# Patient Record
Sex: Male | Born: 1935 | Race: White | Hispanic: No | Marital: Married | Smoking: Never smoker
Health system: Southern US, Community
[De-identification: ages and names within clinical notes are randomized; demographics above are authoritative.]

## PROBLEM LIST (undated history)

## (undated) DIAGNOSIS — K219 Gastro-esophageal reflux disease without esophagitis: Secondary | ICD-10-CM

## (undated) DIAGNOSIS — H3530 Unspecified macular degeneration: Secondary | ICD-10-CM

## (undated) DIAGNOSIS — N400 Enlarged prostate without lower urinary tract symptoms: Secondary | ICD-10-CM

## (undated) DIAGNOSIS — E785 Hyperlipidemia, unspecified: Secondary | ICD-10-CM

## (undated) HISTORY — PX: HERNIA REPAIR: SHX51

## (undated) HISTORY — PX: HEMORRHOID SURGERY: SHX153

## (undated) HISTORY — PX: TONSILLECTOMY: SUR1361

## (undated) HISTORY — PX: CATARACT EXTRACTION: SUR2

---

## 1999-02-16 ENCOUNTER — Ambulatory Visit (HOSPITAL_COMMUNITY): Admission: RE | Admit: 1999-02-16 | Discharge: 1999-02-16 | Payer: Self-pay | Admitting: *Deleted

## 1999-05-25 ENCOUNTER — Ambulatory Visit (HOSPITAL_COMMUNITY): Admission: RE | Admit: 1999-05-25 | Discharge: 1999-05-25 | Payer: Self-pay | Admitting: *Deleted

## 1999-11-02 ENCOUNTER — Encounter: Admission: RE | Admit: 1999-11-02 | Discharge: 1999-11-02 | Payer: Self-pay | Admitting: *Deleted

## 1999-11-02 ENCOUNTER — Encounter: Payer: Self-pay | Admitting: *Deleted

## 1999-12-14 ENCOUNTER — Encounter: Payer: Self-pay | Admitting: *Deleted

## 1999-12-14 ENCOUNTER — Ambulatory Visit (HOSPITAL_COMMUNITY): Admission: RE | Admit: 1999-12-14 | Discharge: 1999-12-14 | Payer: Self-pay | Admitting: *Deleted

## 2001-08-07 ENCOUNTER — Ambulatory Visit (HOSPITAL_COMMUNITY): Admission: RE | Admit: 2001-08-07 | Discharge: 2001-08-07 | Payer: Self-pay | Admitting: *Deleted

## 2002-07-23 ENCOUNTER — Encounter: Payer: Self-pay | Admitting: Family Medicine

## 2002-07-23 ENCOUNTER — Encounter: Admission: RE | Admit: 2002-07-23 | Discharge: 2002-07-23 | Payer: Self-pay | Admitting: Family Medicine

## 2003-06-25 ENCOUNTER — Encounter: Payer: Self-pay | Admitting: Urology

## 2003-06-29 ENCOUNTER — Inpatient Hospital Stay (HOSPITAL_COMMUNITY): Admission: RE | Admit: 2003-06-29 | Discharge: 2003-07-01 | Payer: Self-pay | Admitting: Urology

## 2004-07-12 ENCOUNTER — Ambulatory Visit (HOSPITAL_COMMUNITY): Admission: RE | Admit: 2004-07-12 | Discharge: 2004-07-12 | Payer: Self-pay | Admitting: Family Medicine

## 2004-12-01 ENCOUNTER — Encounter: Admission: RE | Admit: 2004-12-01 | Discharge: 2005-01-18 | Payer: Self-pay | Admitting: Neurology

## 2007-04-11 ENCOUNTER — Encounter: Payer: Self-pay | Admitting: Family Medicine

## 2007-04-25 ENCOUNTER — Ambulatory Visit (HOSPITAL_COMMUNITY): Admission: RE | Admit: 2007-04-25 | Discharge: 2007-04-25 | Payer: Self-pay | Admitting: Interventional Radiology

## 2007-05-12 ENCOUNTER — Encounter: Payer: Self-pay | Admitting: Interventional Radiology

## 2007-06-18 ENCOUNTER — Ambulatory Visit (HOSPITAL_COMMUNITY): Admission: RE | Admit: 2007-06-18 | Discharge: 2007-06-18 | Payer: Self-pay | Admitting: Interventional Radiology

## 2007-06-24 ENCOUNTER — Encounter (INDEPENDENT_AMBULATORY_CARE_PROVIDER_SITE_OTHER): Payer: Self-pay | Admitting: Interventional Radiology

## 2007-06-24 ENCOUNTER — Ambulatory Visit (HOSPITAL_COMMUNITY): Admission: RE | Admit: 2007-06-24 | Discharge: 2007-06-24 | Payer: Self-pay | Admitting: Interventional Radiology

## 2007-07-09 ENCOUNTER — Encounter: Payer: Self-pay | Admitting: Interventional Radiology

## 2008-03-19 ENCOUNTER — Ambulatory Visit (HOSPITAL_COMMUNITY): Admission: RE | Admit: 2008-03-19 | Discharge: 2008-03-19 | Payer: Self-pay | Admitting: *Deleted

## 2008-08-20 ENCOUNTER — Ambulatory Visit (HOSPITAL_COMMUNITY): Admission: RE | Admit: 2008-08-20 | Discharge: 2008-08-20 | Payer: Self-pay | Admitting: *Deleted

## 2009-04-12 ENCOUNTER — Ambulatory Visit (HOSPITAL_BASED_OUTPATIENT_CLINIC_OR_DEPARTMENT_OTHER): Admission: RE | Admit: 2009-04-12 | Discharge: 2009-04-13 | Payer: Self-pay | Admitting: Urology

## 2009-04-12 ENCOUNTER — Encounter (INDEPENDENT_AMBULATORY_CARE_PROVIDER_SITE_OTHER): Payer: Self-pay | Admitting: Urology

## 2010-07-20 ENCOUNTER — Encounter (INDEPENDENT_AMBULATORY_CARE_PROVIDER_SITE_OTHER): Payer: Self-pay | Admitting: Urology

## 2010-07-20 ENCOUNTER — Ambulatory Visit (HOSPITAL_COMMUNITY): Admission: RE | Admit: 2010-07-20 | Discharge: 2010-07-21 | Payer: Self-pay | Admitting: Urology

## 2010-07-23 ENCOUNTER — Emergency Department (HOSPITAL_COMMUNITY): Admission: EM | Admit: 2010-07-23 | Discharge: 2010-07-23 | Payer: Self-pay | Admitting: Emergency Medicine

## 2010-08-25 ENCOUNTER — Ambulatory Visit (HOSPITAL_BASED_OUTPATIENT_CLINIC_OR_DEPARTMENT_OTHER): Admission: RE | Admit: 2010-08-25 | Discharge: 2010-08-25 | Payer: Self-pay | Admitting: Urology

## 2010-12-28 LAB — CBC
HCT: 42.9 % (ref 39.0–52.0)
MCH: 30.9 pg (ref 26.0–34.0)
MCV: 91.5 fL (ref 78.0–100.0)
Platelets: 165 10*3/uL (ref 150–400)
RBC: 4.69 MIL/uL (ref 4.22–5.81)
WBC: 6.8 10*3/uL (ref 4.0–10.5)

## 2010-12-28 LAB — URINALYSIS, ROUTINE W REFLEX MICROSCOPIC
Glucose, UA: NEGATIVE mg/dL
Ketones, ur: 15 mg/dL — AB
Protein, ur: >300 mg/dL — AB
pH: 6 (ref 5.0–8.0)

## 2010-12-28 LAB — BASIC METABOLIC PANEL
BUN: 11 mg/dL (ref 6–23)
CO2: 28 mEq/L (ref 19–32)
Chloride: 108 mEq/L (ref 96–112)
Creatinine, Ser: 1.09 mg/dL (ref 0.4–1.5)
GFR calc Af Amer: >60 mL/min (ref >60)
Potassium: 5.5 mEq/L — ABNORMAL HIGH (ref 3.5–5.1)

## 2010-12-28 LAB — URINE CULTURE: Culture  Setup Time: 201110091725

## 2010-12-28 LAB — URINE MICROSCOPIC-ADD ON

## 2011-01-22 LAB — POCT HEMOGLOBIN-HEMACUE: Hemoglobin: 15.4 g/dL (ref 13.0–17.0)

## 2011-02-27 NOTE — Op Note (Signed)
NAME:  Jonathan Newton, Jonathan Newton NO.:  [id number]   MEDICAL RECORD NO.:  [id number]          PATIENT TYPE:  AMB   LOCATION:  ENDO                         FACILITY:  [facility]   PHYSICIAN:  Georgiana Spinner, M.D.    DATE OF BIRTH:  [birth year]   DATE OF PROCEDURE:  08/20/2008  DATE OF DISCHARGE:                               OPERATIVE REPORT   PROCEDURE:  Colonoscopy.   INDICATIONS FOR PROCEDURE:  Colon polyp followup.   ANESTHESIA:  Fentanyl 75 mcg, Versed 7.5 mg.   PROCEDURE IN DETAIL:  With the patient mildly sedated in the left  lateral decubitus position a rectal examination was performed which was  unremarkable.  Subsequently the Pentax videoscopic colonoscope was  inserted in the rectum and passed under direct vision to the cecum.  With pressure applied and the patient rolled to his back and  subsequently to his right side cecum was identified by the ileocecal  valve and base of cecum both of which were photographed.  From this  point the colonoscope was slowly withdrawn taking circumferential views  of colonic mucosa stopping in the rectum which appeared normal on direct  and showed hemorrhoids on retroflexed view.  The endoscope was  straightened and withdrawn.  The patient's vital signs, pulse oximeter  remained stable.  The patient tolerated the procedure well without  apparent complications.   FINDINGS:  Internal hemorrhoids, some diverticulosis of sigmoid colon,  otherwise unremarkable exam.   PLAN:  Have patient follow up with me as needed.           ______________________________  Georgiana Spinner, M.D.     GMO/MEDQ  D:  08/20/2008  T:  08/20/2008  Job:  [id number]

## 2011-02-27 NOTE — Op Note (Signed)
NAME:  Jonathan Newton, Jonathan Newton NO.:  [id number]   MEDICAL RECORD NO.:  [id number]          PATIENT TYPE:  AMB   LOCATION:  ENDO                         FACILITY:  [facility]   PHYSICIAN:  Georgiana Spinner, M.D.    DATE OF BIRTH:  [birth year]   DATE OF PROCEDURE:  03/19/2008  DATE OF DISCHARGE:                               OPERATIVE REPORT   PROCEDURE:  Upper endoscopy with dilation.   ENDOSCOPIST:  Georgiana Spinner, M.D.   INDICATIONS:  Dysphagia.   ANESTHESIA:  Fentanyl 50 mcg, Versed 3 mg.   PROCEDURE:  With the patient mildly sedated in room 4 of Endoscopy at  [facility], the Pentax videoscopic endoscope was inserted in  the mouth and passed under direct vision through the esophagus, which  appeared normal, where we reached the distal esophagus and there  appeared to be resistance to the passage of the endoscope, but we were  able to get the endoscope through and it was tight enough that it caused  a dilation of the distal esophagus with some bleeding noted, minimal,  but we were able to advance into the stomach; fundus, body, antrum,  duodenal bulb and second portion of the duodenum were visualized.  From  this point, the endoscope was slowly withdrawn, taking circumferential  views of duodenal mucosa until the endoscope had been pulled back into  the stomach and placed in retroflexion to view the stomach from below.  The endoscope was then straightened and withdrawn, taking  circumferential views of the remaining gastric and esophageal mucosa.  I  elected at that point to not dilate any further because I felt that the  dilation done by the endoscope was adequate.  The endoscope was then  withdrawn, taking circumferential views of the remaining gastric and  esophageal mucosa.  The patient's vital signs and pulse oximetry  remained stable.  The patient tolerated procedure well without apparent  complications.   FINDINGS:  Distal esophageal stricture, dilated to  approximately 10 mm.   PLAN:  We will await clinical response  We will have the patient follow  up with me as an outpatient.           ______________________________  Georgiana Spinner, M.D.     GMO/MEDQ  D:  03/19/2008  T:  03/19/2008  Job:  [id number]   cc:   Burnell Blanks, MD  Fax: [phone]

## 2011-02-27 NOTE — Consult Note (Signed)
NAME:  Jonathan Newton, LOMELI NO.:  [id number]   MEDICAL RECORD NO.:  [id number]          PATIENT TYPE:  OUT   LOCATION:  XRAY                         FACILITY:  [facility]   PHYSICIAN:  Sanjeev K. Deveshwar, M.D.DATE OF BIRTH:  [birth year]   DATE OF CONSULTATION:  04/11/2007  DATE OF DISCHARGE:                                 CONSULTATION   CHIEF COMPLAINT:  Compression fracture.   HISTORY OF PRESENT ILLNESS:  This is a very pleasant, active, alert 77-  year-old, white male with history of multiple falls in the past 4-5  weeks.  He has been referred to Dr. [name] through the courtesy of  Dr. Nathanial Rancher for evaluation of a compression fracture, no doubt as a  result of one of his falls.  An MRI of the lumbar spine was performed on  April 02, 2007, that showed a moderately severe compression fracture of  L1 that appeared to be recent.  There was also some retropulsion noted.  There was a mild paraspinous soft tissue thickening of uncertain  etiology.   The patient is accompanied today by his wife and his daughter-in-law.  His daughter-in-law is an Charity fundraiser.  She reports that the patient has had  multiple falls, but yet stays very active.  The patient has been  experiencing back pain especially when getting up in the morning.  He  states his pain is 10/10.  He does better at rest and seems to do better  throughout the day once he gets moving.  The patient stays active with  yard work, although his activity has been limited due to his pain.  He  is taking Aleve and Tylenol.  Apparently, he did have a prescription for  some Vicodin, however, this caused severe constipation.  He presents  today for further evaluation by Dr. [name].   PAST MEDICAL HISTORY:  1. Hyperlipidemia.  2. Gastroesophageal reflux disease.  3. History of colon polyps followed by Dr. Virginia Rochester.  He is due for a      followup exam at this time.  4. Sinus trouble.  5. Herpes simplex.  6. Elevated homocysteine  level.  7. Benign prostatic hypertrophy as well as a history of bladder stones      followed by Dr. Wanda Plump.  8. Macular degeneration.  9. History of osteoporosis with multiple fractures including fractures      of his right wrist, right rib, ankle and right foot.  Apparently,      he was on Actonel in the past but has no longer taking this      medication.  10.The patient has cataracts.  11.History of esophageal strictures.  12.Precancerous polyps.   PAST SURGICAL HISTORY:  1. Tonsillectomy.  2. Bilateral inguinal hernia repair.  3. Hemorrhoid surgery.  4. Bilateral cataract surgery.  5. History of extraction of bladder stones.  6. The patient reports no previous problems with anesthesia.   ALLERGIES:  No known drug allergies, although he has had some stomach  upset with CODEINE and severe constipation with other NARCOTIC  MEDICATIONS.   CURRENT MEDICATIONS:  Ocuvite, Aleve, Tylenol, aspirin  81 mg daily and  Prilosec OTC.   SOCIAL HISTORY:  The patient is married.  He has three grown children.  He lives in [place] with his wife.  He does not use alcohol or tobacco.  He is a retired Electrical engineer.  As noted, he has been very active for  his age until his recent fractures.   FAMILY HISTORY:  His mother died from heart problems.  His father died  at an early age from suicide.   IMPRESSION/RECOMMENDATIONS:  As noted, this patient has a history of an  L1 compression fracture, possibly related to recent falls.  He had an  MRI of the lumbar spine on April 02, 2007, that showed a fairly acute L1  fracture with some retropulsion as well as a mild paraspinous soft  tissue thickening.  The patient and his family brought these films along  for the consultation.  Dr. [name] went over the images and pointed  out the areas of concern as well as the area of retropulsion.  The  kyphoplasty and vertebroplasty procedures were described in detail along  with the potential risks and  benefits.  Because of the retropulsion, it  was felt that the patient might require a vertebroplasty instead of a  kyphoplasty, however, Dr. [name] could not be certain at this time.   The patient and his family are interested in proceeding for pain relief  and stabilization of the fracture.  Unfortunately, Dr. [name] will be  out of the office next week.  We have arranged for the patient to return  on July 11, for his intervention.  We stress that he should not do  anything strenuous from now until then to prevent further compression  and possible further retropulsion.  The patient was also told to hold  his aspirin on the day of the procedure.  Greater than 40 minutes was  spent on this consult.      Delton See, P.A.    ______________________________  Grandville Silos. [name], M.D.    DR/MEDQ  D:  04/11/2007  T:  04/12/2007  Job:  [id number]   cc:   Burnell Blanks, M.D.

## 2011-02-27 NOTE — Op Note (Signed)
NAME:  Jonathan Newton, Jonathan Newton                ACCOUNT NO.:  [id number]   MEDICAL RECORD NO.:  [id number]          PATIENT TYPE:  AMB   LOCATION:  [facility]                         FACILITY:  [facility]   PHYSICIAN:  Sigmund I. Patsi Sears, M.D.DATE OF BIRTH:  08-17-1917   DATE OF PROCEDURE:  04/12/2009  DATE OF DISCHARGE:                               OPERATIVE REPORT   PREOPERATIVE DIAGNOSES:  Benign prostatic hypertrophy with prostate  stones.   POSTOPERATIVE DIAGNOSES:  Benign prostatic hypertrophy with prostate  stones.   OPERATION:  Cystourethroscopy, transurethral resection of prostate,  removal of prostate stones.   SURGEON:  Dr. Patsi Sears.   ANESTHESIA:  General LMA.   PREPARATION:  After appropriate preanesthesia, the patient was brought  to the operating room and placed in the operating room in the dorsal  supine position where general LMA anesthesia was induced.  He was then  replaced in the dorsal lithotomy position where the pubis was prepped  with Betadine solution and draped in the usual fashion.   REVIEW OF HISTORY:  This 77 year old male has a history of gross  hematuria and a history of bladder stones.  He also has CT scan showing  a probable renal cell carcinoma within a cyst.  In addition, cystoscopy  shows the patient has multiple bladder and prostatic stones and bleeding  prostate.  He is now for resection.   PROCEDURE:  Using the Gyrus equipment, vaporization was accomplished of  the prostate and chips were evacuated from the bladder.  Resection was  accomplished from the 2 o'clock to the 6 o'clock position, and from the  11 o'clock to the 6 o'clock position.  Resection was not needed from the  7 to the 5 o'clock positions.  Prostate stones and stones from the  bladder neck were removed by dragging stones through the scope.  The  chips were evacuated from the bladder, and 24 Simplastic catheter placed  to light traction.  The patient was awakened and taken to the  recovery  room in good condition.      Sigmund I. Patsi Sears, M.D.  Electronically Signed     SIT/MEDQ  D:  04/12/2009  T:  04/13/2009  Job:  [id number]

## 2011-02-27 NOTE — Op Note (Signed)
NAME:  Jonathan Newton, Jonathan Newton                ACCOUNT NO.:  [id number]   MEDICAL RECORD NO.:  [id number]          PATIENT TYPE:  AMB   LOCATION:  [facility]                         FACILITY:  [facility]   PHYSICIAN:  Sigmund I. Patsi Sears, M.D.DATE OF BIRTH:  13-Mar-1917   DATE OF PROCEDURE:  04/12/2009  DATE OF DISCHARGE:                               OPERATIVE REPORT   PREOPERATIVE DIAGNOSIS:  Benign prostatic hypertrophy.   POSTOPERATIVE DIAGNOSES:  Benign prostatic hypertrophy, bladder stones.   OPERATIONS:  Cystourethroscopy, transurethral resection of the prostate  with electrovaporization the tissue, and removal of multiple prostatic  stones.   SURGEON:  Sigmund I. Patsi Sears, M.D.   ANESTHESIA:  General LMA.   PREPARATION:  After appropriate preanesthesia, the patient is brought to  the operating room, placed on the operating table in dorsal supine  position where general LMA anesthesia was induced.  He was then replaced  in the dorsal lithotomy position where the pubis was prepped with  Betadine solution and draped in usual fashion.   BRIEF HISTORY:  This 77 year old male has presented with gross painless  hematuria, history of BPH and bladder stones.  CT was accomplished after  an abnormal renal ultrasound was obtained.  The patient was found to  have a 5.5 cm right lower pole exophytic cyst with a 0.9 mm nodule  compatible with cystic renal cell carcinoma.  Cystoscopy revealed the  patient had gross hematuria from bladder stones, prostate stones, and  previously resected prostate fossa with massive regrowth.  The patient  now for TURP and removal of prostate and bladder stone.   PROCEDURE:  Cystourethroscopy accomplished, and photo documentation of  multiple stones were identified.  Stones were removed of the irrigation  catheter and large stone was directed out through the scope.  Vaporization of prostate was then accomplished, beginning at the 11  o'clock position, carried to 6 o'clock  position, and from the 2 o'clock  position to the 6 o'clock position.  There was no resection necessary  from the 11 o'clock to 7 o'clock position because of previous resection.  The verumontanum was identified within the procedure for the entire  case.  A 24 three-way Symplastic catheter was placed, with a plug in the  third lumen.  30 mL was placed in the balloon.  Catheter was irrigated  clear.  The patient will be placed on mild traction, was awakened, and  taken to recovery room in good condition.      Sigmund I. Patsi Sears, M.D.  Electronically Signed     SIT/MEDQ  D:  04/12/2009  T:  04/13/2009  Job:  [id number]

## 2011-03-02 NOTE — Procedures (Signed)
[facility]  Patient:    CAYTON, CUEVAS Visit Number: [id number] MRN: [id number]          Service Type: END Location: ENDO Attending Physician:  Sabino Gasser Proc. Date: 08/07/01 Admit Date:  08/07/2001   CC:         Lenon Curt. Cassell Clement, M.D.   Procedure Report  PROCEDURE:  Colonoscopy.  INDICATIONS:  Colon cancer in a polyp found two years ago.  ANESTHESIA:  Demerol 70, Versed 7 mg.  DESCRIPTION OF PROCEDURE:  With the patient mildly sedated in the left lateral decubitus position, a rectal examination was performed which revealed an enlarged prostate.  Subsequently, the Olympus videoscopic colonoscope was inserted into the rectum and passed under direct vision.  With pressure applied to the abdomen, we finally reached the cecum.  He had a very long colon.  The cecum identified by the ileocecal valve and appendiceal orifice, both of which were photographed.  From this point, the endoscope was slowly withdrawn, taking circumferential views of the entire colonic mucosa, stopping in the rectum which appeared normal on direct view and showed hemorrhoids on retroflexed view.  The endoscope was straightened and withdrawn.  The patients vital signs and pulse oximeter remained stable.  The patient tolerated the procedure well without apparent complications.  FINDINGS:  Large internal hemorrhoids, otherwise unremarkable examination.  PLAN:  Repeat examination in five years. Attending Physician:  Sabino Gasser DD:  08/07/01 TD:  08/08/01 Job: [id number]

## 2011-03-02 NOTE — Discharge Summary (Signed)
   NAME:  Jonathan Newton, CARRERAS NO.:  [id number]   MEDICAL RECORD NO.:  [id number]                   PATIENT TYPE:  INP   LOCATION:  0359                                 FACILITY:  [facility]   PHYSICIAN:  Boston Service, M.D.             DATE OF BIRTH:  05-Oct-1917   DATE OF ADMISSION:  06/29/2003  DATE OF DISCHARGE:  07/01/2003                                 DISCHARGE SUMMARY   LMD:  Burnell Blanks, M.D.   UROLOGIST:  Boston Service, M.D.   Indications, medications, allergies, tobacco, ETOH, past medical history,  social history, physical examination, and review of systems were all  outlined in the admitting note.   HOSPITAL COURSE:  The patient underwent TURP, TUR VaporTrode and  cystolithopexy of six 1 cm stones within the bladder on June 29, 2003.  A 24-French Foley was left indwelling.  The patient had a pleasantly  uneventful postoperative recovery.  Bladder irrigation was discontinued on  the first evening several hours after surgery.  Foley catheter was  discontinued on postoperative day one.  The patient had initial urgency and  occasional urge incontinence.  Urine was cherry red without clots.  The  patient was followed in the hospital on IV hydration.  Urine cleared.  Urinary control improved and patient was ready for discharge on July 01, 2003.   DISCHARGE MEDICATIONS:  1. Macrobid.  2. Pyridium.  3. Vicodin.   FOLLOWUP:  The patient will see [place] back in the office in two weeks.   DISCHARGE DIAGNOSES:  1. Bladder calculi.  2. Benign prostatic hypertrophy.   CONDITION ON DISCHARGE:  Fair.                                               Boston Service, M.D.    RH/MEDQ  D:  07/01/2003  T:  07/01/2003  Job:  [id number]   cc:   Burnell Blanks, M.D.

## 2011-03-02 NOTE — Consult Note (Signed)
NAME:  Jonathan Newton, Jonathan Newton                ACCOUNT NO.:  [id number]   MEDICAL RECORD NO.:  [id number]          PATIENT TYPE:  OUT   LOCATION:  [facility]C                          FACILITY:  [facility]   PHYSICIAN:  Sanjeev K. Deveshwar, M.D.DATE OF BIRTH:  1917/04/18   DATE OF CONSULTATION:  05/12/2007  DATE OF DISCHARGE:                                 CONSULTATION   CHIEF COMPLAINT:  Status post L1 vertebroplasty performed April 25, 2007.   HISTORY OF PRESENT ILLNESS:  This is a very pleasant, active 77 year old  male with a history of multiple falls.  He was referred to Dr. [name]  through the courtesy of Dr. Nathanial Rancher for evaluation of a compression  fracture which was felt to be the result of one of his falls.  An MRI of  the lumbar spine was performed on April 02, 2007, that showed a  moderately severe compression fracture of L1 associated with some  retropulsion.  There was also a mild paraspinous soft tissue thickening  of uncertain etiology.  The patient was seen in consultation by Dr.  [name] on April 11, 2007.  The vertebroplasty and kyphoplasty  procedures were described in detail along with the risks and benefits.  The patient agreed to proceed and on April 25, 2007, he underwent a  vertebroplasty at the L1 level performed by Dr. [name].  A  kyphoplasty was not performed due to the degree of retropulsion.  The  patient returns today accompanied by his daughter-in-law to be seen in  follow-up.   PAST MEDICAL HISTORY:  Significant for  1. Hyperlipidemia.  2. Gastroesophageal reflux disease.  3. History of a colon polyps followed by Dr. Virginia Rochester.  4. History of sinus trouble.  5. Herpes simplex.  6. Elevated homocysteine level.  7. BPH and bladder stones followed by Dr. Wanda Plump.  8. Macular degeneration.  9. Cataracts.  10.Osteoporosis with multiple fractures including fractures of his      right wrist, right ribs, ankle and right foot.  He had been on      Actonel in the past but he is  no longer taking this medication.  11.He has history of esophageal strictures.  12.History of precancerous polyps.   SURGICAL HISTORY:  Significant for  1. Tonsillectomy.  2. Bilateral inguinal hernia repair.  3. Hemorrhoidectomy.  4. Bilateral cataract surgery.  5. History of extraction of bladder stones.  The patient reports no previous problems with anesthesia.   ALLERGIES:  CODEINE causes stomach upset.  He developed severe  constipation with other narcotic medications.   MEDICATIONS:  1. Ocuvite.  2. Aleve.  3. Tylenol.  4. Aspirin 81 mg daily.  5. Prilosec OTC.   SOCIAL HISTORY:  The patient is married.  He has three grown children.  He lives in [place], [place], with his wife.  They have a fairly  large amount of property and the patient stays active with the up-keep  of the grounds.  He is a retired Electrical engineer.  His wife was recently  at [facility] for placement of a permanent pacemaker.   FAMILY HISTORY:  His mother died from heart problems.  His father died  at an early age from suicide.   IMPRESSION AND PLAN:  As noted, the patient returns today to be seen in  follow-up by Dr. [name] after his L1 compression fracture with  vertebroplasty performed April 25, 2007.  The patient reports that his  back pain has completely resolved.  He does have some abdominal  discomfort which he attributes to possible constipation.  He is no  longer taking any pain medications other than occasional Aleve.  He is  anxious to resume his previous level of activity.  He was cautioned to  avoid anything that might put further stress on his back in order to  avoid further compression fractures.  The patient also noted some mild  left hip soreness.  The etiology of this discomfort is uncertain at this  time.  Hopefully, this will continues to improve.   The patient reports his appetite is fine.  He is doing well overall.  Further follow-up will be on a p.r.n. basis.   The patient was told to  contact us or Dr. Nathanial Rancher if he develops any further severe back pain.  Greater than 15 minutes was spent on this visit.      Delton See, P.A.    ______________________________  Grandville Silos. [name], M.D.    DR/MEDQ  D:  05/12/2007  T:  05/13/2007  Job:  [id number]   cc:   Burnell Blanks, MD

## 2011-03-02 NOTE — Op Note (Signed)
NAME:  Jonathan Newton, Jonathan Newton NO.:  [id number]   MEDICAL RECORD NO.:  [id number]                   PATIENT TYPE:  INP   LOCATION:  0007                                 FACILITY:  [facility]   PHYSICIAN:  Boston Service, M.D.             DATE OF BIRTH:  [birth year]   DATE OF PROCEDURE:  06/29/2003  DATE OF DISCHARGE:                                 OPERATIVE REPORT   PREOPERATIVE DIAGNOSIS:  An 77 year old male with benign prostatic  hypertrophy and a bladder stone, transurethral resection and cystolithopexy  in January 1985.  The patient has had intermittent follow-up with our office  since then.  Cystoscopy in July 2003 showed recurrent bladder calculi; the  patient refused treatment at that time.  He presented back to the office in  August 2004.  A renal ultrasound showed a 9.4 cm right kidney, a 10.7 cm  left kidney.  Cystoscopy showed about six calculi within the bladder; each  of them measured about 1 cm total stone size greater than 5 cm, with  nonobstructive regrowth within the prostatic urethra.  Discussed with the  patient and family members, the risks and benefits.  They understand and  agree to proceed with cystoscopy under anesthesia.  Cystolithopexy (Homium  laser), TURP, TUR VaporTrode.   POSTOPERATIVE DIAGNOSES:  Same.   PROCEDURE:  1. Cystoscopy under anesthesia.  2. TURP/TUR VaporTrode.  3. Cystolithopexy.   DRAINS:  24-French Foley catheter.   ANESTHESIA:  General.   DESCRIPTION OF PROCEDURE:  The patient was prepped and draped in the dorsal  lithotomy position, after institution of adequate level of general  anesthesia.  Well-lubricated 21-French panendoscope, with the 1000 Homium  fiber, was then passed per urethra.  Normal urethra, active and intact  sphincter.  The prostatic urethra showed nonobstructive regrowth.  The  patient had six stones within the bladder, each of which measured about 1  cm.   Fragmentation was begun with  the 1000 Homium fiber.  Stones were  demonstrated to be quite dense, and over a period of 60-90 mins they were  gradually fragmented to the point that they could be evacuated with the  Helico evacuator.  Stony fragments were collected, and then the scope was  removed and replaced with the resectoscope sheath.  TURP/TUR VaporTrode  instruments were used.  Resection and vaporization was begun at the 10  o'clock position on the right lobe and carried down to 6 o'clock position,  begun again at the 2 o'clock position on the left  lobe and carried down to the 6 o'clock position.  Once all obstructing  tissue had either been resected or vaporized, resectoscope sheath was  withdrawn.  A 24-French three-way Foley catheter was inserted, left to light  traction.  The patient was given a B&O suppository and returned to recovery  in satisfactory condition.  Boston Service, M.D.    RH/MEDQ  D:  06/29/2003  T:  06/29/2003  Job:  [id number]   cc:   Burnell Blanks, M.D.  [place], [place]

## 2011-03-27 IMAGING — CR DG CHEST 2V
2 series · 2 of 2 positions shown · non-contrast
Comparison: 04/12/2009

CLINICAL DATA: Preoperative respiratory exam.  Chronic urinary
retention.

CHEST - 2 VIEW

[w chest pa]
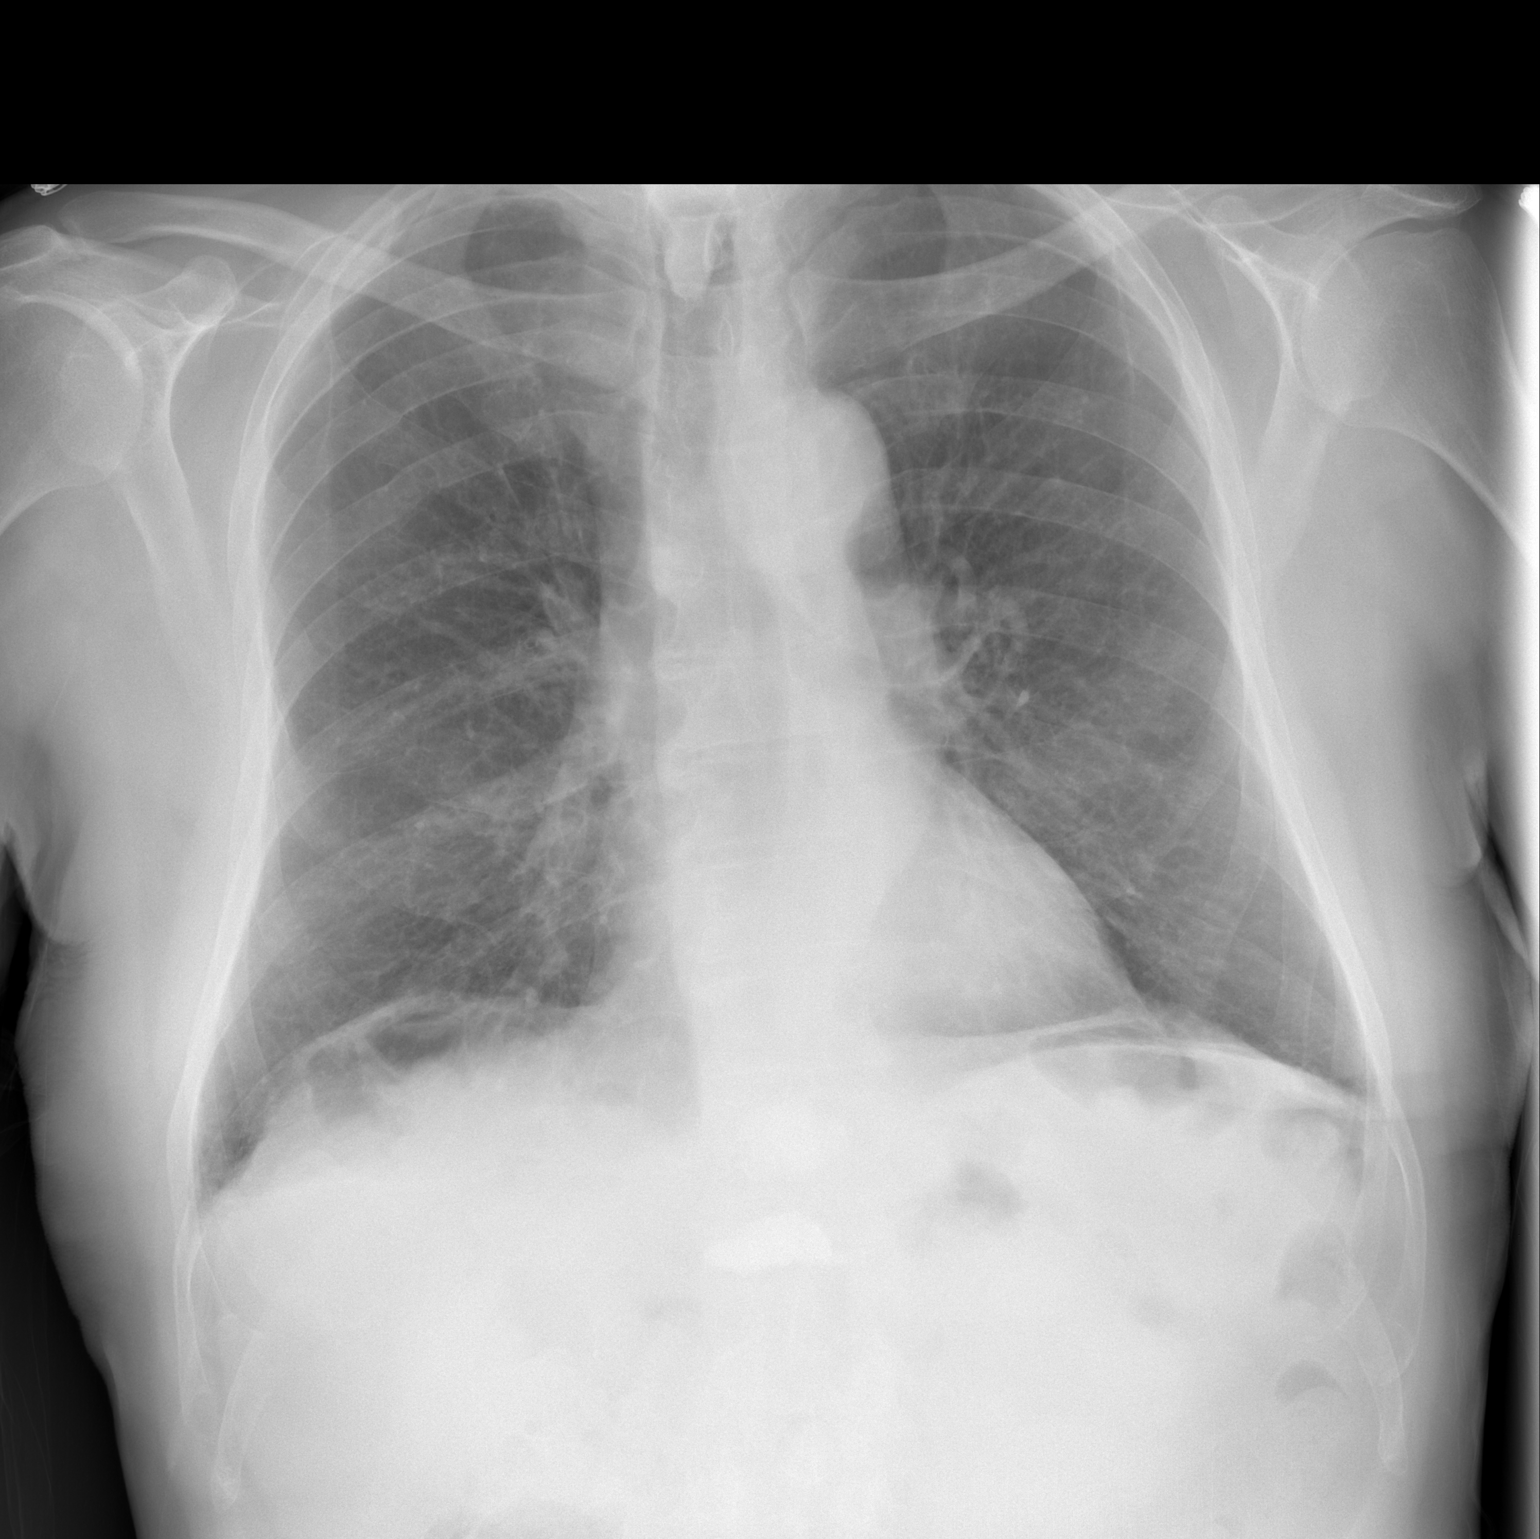

[w chest lat]
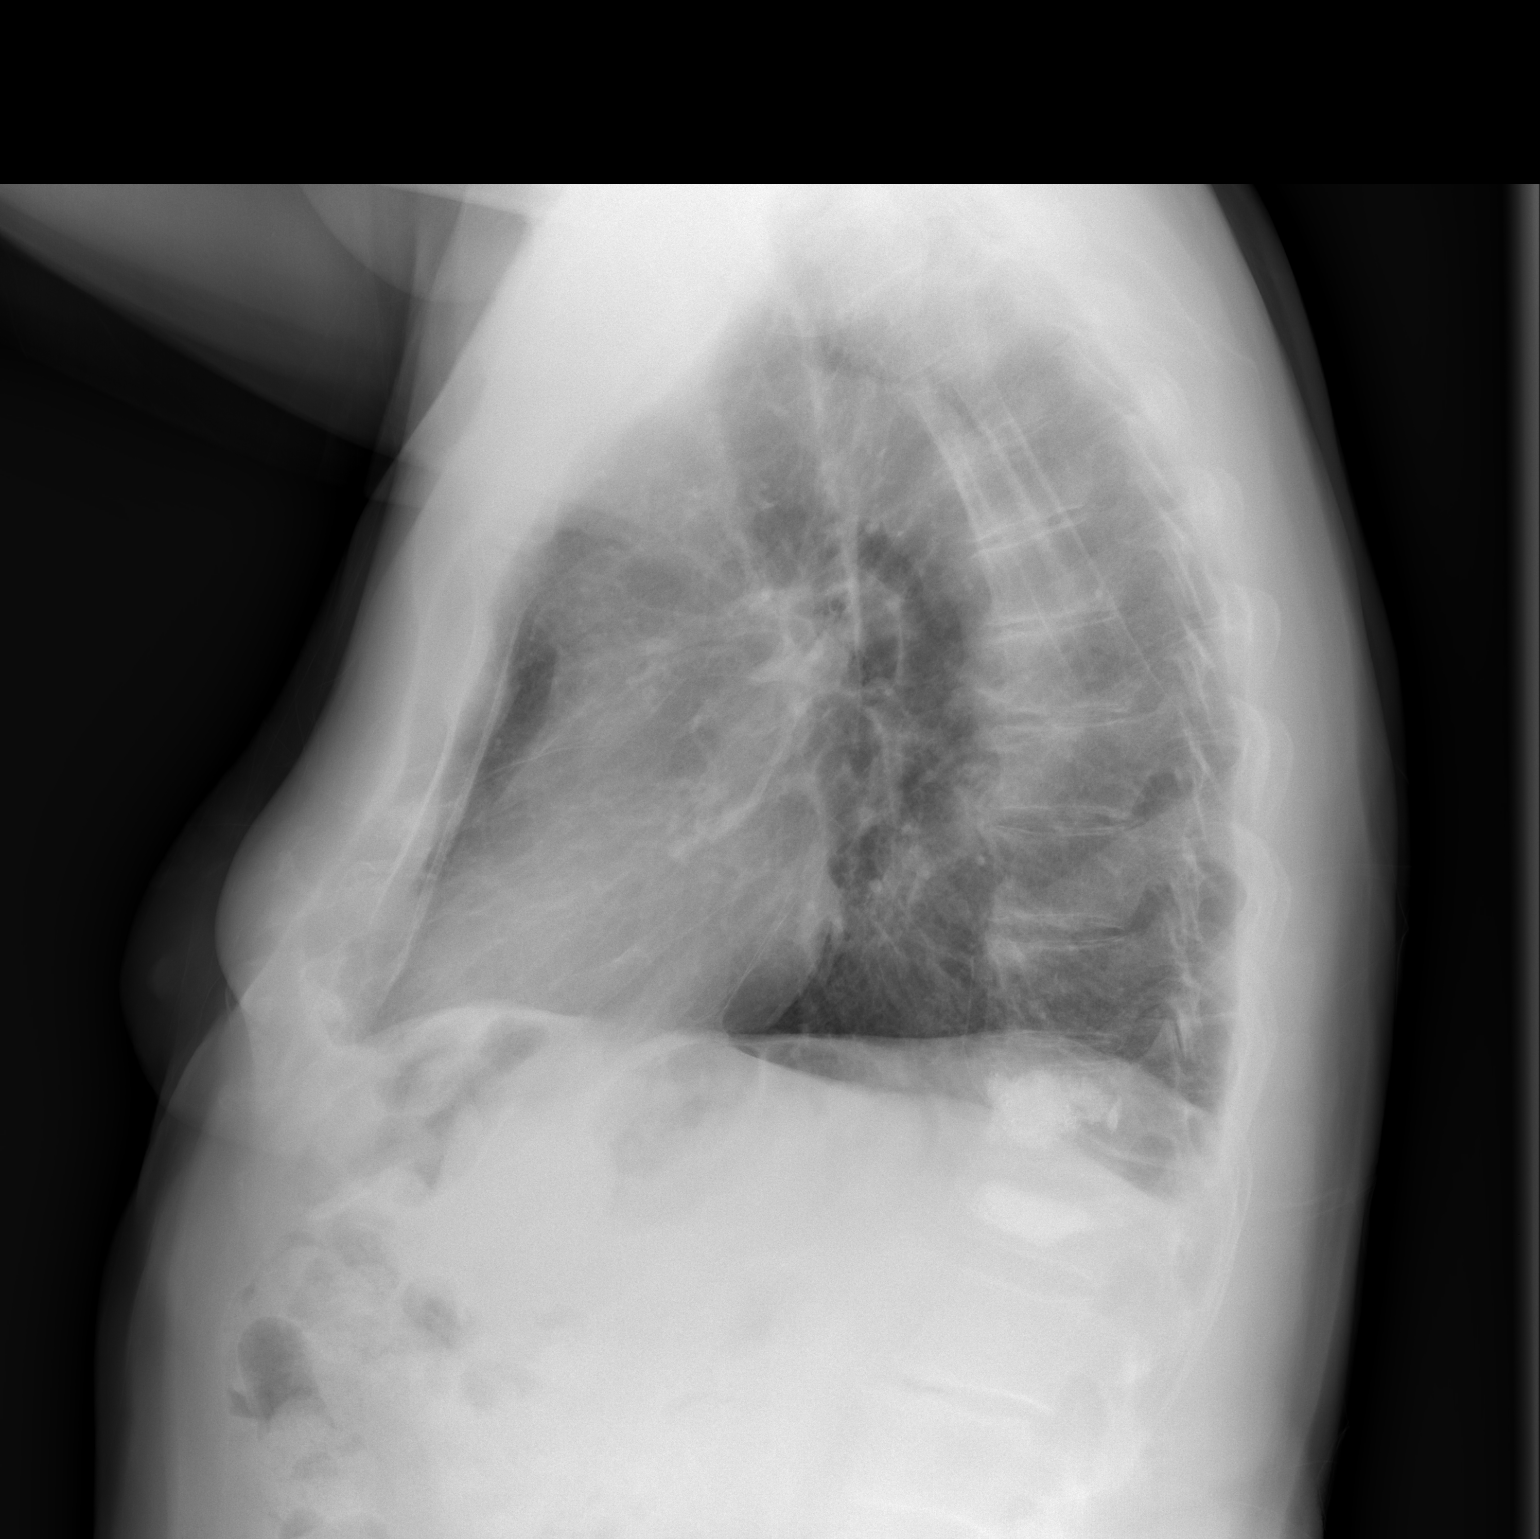

[2 of 2 positions shown; findings below may reference images not displayed]

FINDINGS: The heart size and pulmonary vascularity are normal and
the lungs are clear although hyperinflated consistent with
emphysema.

There are old compression fractures in the thoracolumbar junction
treated with vertebroplasty.
IMPRESSION: No acute abnormalities.  Probable emphysema.

## 2011-07-27 LAB — CBC
MCV: 87.8
Platelets: 196
RBC: 4.94
WBC: 8.9

## 2011-07-27 LAB — CREATININE, SERUM
Creatinine, Ser: 0.89
GFR calc non Af Amer: >60

## 2011-07-27 LAB — BASIC METABOLIC PANEL
BUN: 18
Calcium: 9.2
Creatinine, Ser: 1
GFR calc Af Amer: >60
GFR calc non Af Amer: >60

## 2011-07-27 LAB — PROTIME-INR
INR: 1.1
Prothrombin Time: 14.5

## 2011-07-27 LAB — BUN: BUN: 13

## 2011-07-31 LAB — CBC
Hemoglobin: 15
RDW: 13.5
WBC: 8.5

## 2011-07-31 LAB — BASIC METABOLIC PANEL
Calcium: 8.9
GFR calc Af Amer: >60
GFR calc non Af Amer: >60
Glucose, Bld: 90
Potassium: 4.3
Sodium: 138

## 2011-07-31 LAB — PROTIME-INR: INR: 1.2

## 2013-02-05 ENCOUNTER — Other Ambulatory Visit (HOSPITAL_COMMUNITY): Payer: Self-pay | Admitting: Interventional Radiology

## 2013-02-05 DIAGNOSIS — M549 Dorsalgia, unspecified: Secondary | ICD-10-CM

## 2013-02-06 ENCOUNTER — Other Ambulatory Visit (HOSPITAL_COMMUNITY): Payer: Self-pay | Admitting: Interventional Radiology

## 2013-02-06 ENCOUNTER — Ambulatory Visit (HOSPITAL_COMMUNITY)
Admission: RE | Admit: 2013-02-06 | Discharge: 2013-02-06 | Disposition: A | Discharge status: Home / Self Care | Payer: Medicare Other | Source: Ambulatory Visit | Attending: Interventional Radiology | Admitting: Interventional Radiology

## 2013-02-06 DIAGNOSIS — IMO0002 Reserved for concepts with insufficient information to code with codable children: Secondary | ICD-10-CM

## 2013-02-06 DIAGNOSIS — S32009A Unspecified fracture of unspecified lumbar vertebra, initial encounter for closed fracture: Secondary | ICD-10-CM | POA: Insufficient documentation

## 2013-02-06 DIAGNOSIS — X58XXXA Exposure to other specified factors, initial encounter: Secondary | ICD-10-CM | POA: Insufficient documentation

## 2013-02-06 DIAGNOSIS — M4120 Other idiopathic scoliosis, site unspecified: Secondary | ICD-10-CM | POA: Insufficient documentation

## 2013-02-06 DIAGNOSIS — M47812 Spondylosis without myelopathy or radiculopathy, cervical region: Secondary | ICD-10-CM | POA: Insufficient documentation

## 2013-02-06 DIAGNOSIS — Q762 Congenital spondylolisthesis: Secondary | ICD-10-CM | POA: Insufficient documentation

## 2013-02-06 DIAGNOSIS — M549 Dorsalgia, unspecified: Secondary | ICD-10-CM

## 2013-02-11 ENCOUNTER — Ambulatory Visit (HOSPITAL_COMMUNITY)
Admission: RE | Admit: 2013-02-11 | Discharge: 2013-02-11 | Disposition: A | Discharge status: Home / Self Care | Payer: Medicare Other | Source: Ambulatory Visit | Attending: Interventional Radiology | Admitting: Interventional Radiology

## 2013-02-11 ENCOUNTER — Other Ambulatory Visit: Payer: Self-pay | Admitting: Radiology

## 2013-02-11 ENCOUNTER — Other Ambulatory Visit (HOSPITAL_COMMUNITY): Payer: Self-pay | Admitting: Interventional Radiology

## 2013-02-11 DIAGNOSIS — IMO0002 Reserved for concepts with insufficient information to code with codable children: Secondary | ICD-10-CM

## 2013-02-12 ENCOUNTER — Encounter (HOSPITAL_COMMUNITY): Payer: Self-pay

## 2013-02-12 ENCOUNTER — Other Ambulatory Visit (HOSPITAL_COMMUNITY): Payer: Self-pay | Admitting: Interventional Radiology

## 2013-02-12 ENCOUNTER — Ambulatory Visit (HOSPITAL_COMMUNITY)
Admission: RE | Admit: 2013-02-12 | Discharge: 2013-02-12 | Disposition: A | Discharge status: Home / Self Care | Payer: Medicare Other | Source: Ambulatory Visit | Attending: Interventional Radiology | Admitting: Interventional Radiology

## 2013-02-12 DIAGNOSIS — E785 Hyperlipidemia, unspecified: Secondary | ICD-10-CM | POA: Insufficient documentation

## 2013-02-12 DIAGNOSIS — S3210XA Unspecified fracture of sacrum, initial encounter for closed fracture: Secondary | ICD-10-CM | POA: Insufficient documentation

## 2013-02-12 DIAGNOSIS — K219 Gastro-esophageal reflux disease without esophagitis: Secondary | ICD-10-CM | POA: Insufficient documentation

## 2013-02-12 DIAGNOSIS — H3530 Unspecified macular degeneration: Secondary | ICD-10-CM | POA: Insufficient documentation

## 2013-02-12 DIAGNOSIS — IMO0002 Reserved for concepts with insufficient information to code with codable children: Secondary | ICD-10-CM

## 2013-02-12 DIAGNOSIS — W19XXXA Unspecified fall, initial encounter: Secondary | ICD-10-CM | POA: Insufficient documentation

## 2013-02-12 DIAGNOSIS — N400 Enlarged prostate without lower urinary tract symptoms: Secondary | ICD-10-CM | POA: Insufficient documentation

## 2013-02-12 DIAGNOSIS — Z885 Allergy status to narcotic agent status: Secondary | ICD-10-CM | POA: Insufficient documentation

## 2013-02-12 DIAGNOSIS — S32009A Unspecified fracture of unspecified lumbar vertebra, initial encounter for closed fracture: Secondary | ICD-10-CM | POA: Insufficient documentation

## 2013-02-12 HISTORY — DX: Gastro-esophageal reflux disease without esophagitis: K21.9

## 2013-02-12 HISTORY — DX: Unspecified macular degeneration: H35.30

## 2013-02-12 HISTORY — DX: Hyperlipidemia, unspecified: E78.5

## 2013-02-12 HISTORY — DX: Benign prostatic hyperplasia without lower urinary tract symptoms: N40.0

## 2013-02-12 LAB — CBC
HCT: 42 % (ref 39.0–52.0)
Hemoglobin: 14.4 g/dL (ref 13.0–17.0)
MCV: 86.4 fL (ref 78.0–100.0)
RBC: 4.86 MIL/uL (ref 4.22–5.81)
WBC: 8.1 10*3/uL (ref 4.0–10.5)

## 2013-02-12 LAB — PROTIME-INR: INR: 1.14 (ref 0.00–1.49)

## 2013-02-12 LAB — APTT: aPTT: 40 seconds — ABNORMAL HIGH (ref 24–37)

## 2013-02-12 LAB — BASIC METABOLIC PANEL
CO2: 29 mEq/L (ref 19–32)
Chloride: 105 mEq/L (ref 96–112)
GFR calc Af Amer: 66 mL/min — ABNORMAL LOW (ref >90)
Potassium: 4.3 mEq/L (ref 3.5–5.1)

## 2013-02-12 MED ORDER — HYDROMORPHONE HCL PF 1 MG/ML IJ SOLN
INTRAMUSCULAR | Status: AC
  Filled 2013-02-12: qty 2

## 2013-02-12 MED ORDER — SODIUM CHLORIDE 0.9 % IV SOLN
INTRAVENOUS | Status: AC
Start: 2013-02-12 — End: 2013-02-12
  Administered 2013-02-12: 13:00:00 via INTRAVENOUS

## 2013-02-12 MED ORDER — GELATIN ABSORBABLE 12-7 MM EX MISC
CUTANEOUS | Status: AC
  Administered 2013-02-12: 1
  Filled 2013-02-12: qty 1

## 2013-02-12 MED ORDER — TOBRAMYCIN SULFATE 1.2 G IJ SOLR
1.2000 g | INTRAMUSCULAR | Status: AC
Start: 2013-02-12 — End: 2013-02-12
  Administered 2013-02-12: 1.2 g via TOPICAL

## 2013-02-12 MED ORDER — MIDAZOLAM HCL 2 MG/2ML IJ SOLN
INTRAMUSCULAR | Status: AC
  Filled 2013-02-12: qty 6

## 2013-02-12 MED ORDER — CEFAZOLIN SODIUM-DEXTROSE 2-3 GM-% IV SOLR
2.0000 g | INTRAVENOUS | Status: AC
  Administered 2013-02-12: 2 g via INTRAVENOUS
  Filled 2013-02-12: qty 50

## 2013-02-12 MED ORDER — MIDAZOLAM HCL 2 MG/2ML IJ SOLN
INTRAMUSCULAR | Status: AC | PRN
  Administered 2013-02-12 (×3): 1 mg via INTRAVENOUS

## 2013-02-12 MED ORDER — FENTANYL CITRATE 0.05 MG/ML IJ SOLN
INTRAMUSCULAR | Status: AC
  Filled 2013-02-12: qty 4

## 2013-02-12 MED ORDER — IOHEXOL 300 MG/ML  SOLN
50.0000 mL | Freq: Once | INTRAMUSCULAR | Status: AC | PRN
  Administered 2013-02-12: 15 mL via INTRAVENOUS

## 2013-02-12 MED ORDER — SODIUM CHLORIDE 0.9 % IV SOLN: INTRAVENOUS | Status: DC

## 2013-02-12 MED ORDER — TOBRAMYCIN SULFATE 1.2 G IJ SOLR
INTRAMUSCULAR | Status: AC
  Filled 2013-02-12: qty 1.2

## 2013-02-12 MED ORDER — FENTANYL CITRATE 0.05 MG/ML IJ SOLN
INTRAMUSCULAR | Status: AC | PRN
  Administered 2013-02-12 (×3): 25 ug via INTRAVENOUS

## 2013-02-12 NOTE — H&P (Signed)
Jonathan Newton is an 79 y.o. male.   Chief Complaint: recent falls at home New back pain x 2-3 weeks MRI reveals Lumbar #5 fracture with B sacral ala fxs Scheduled now for L5 vertebroplasty/kyphoplasty and Sacroplasty Previous L1 KP 04/2007 and T12 06/2007 HPI: HLD; GERD; BPH; mac degeneration  Past Medical History  Diagnosis Date  . Hyperlipidemia   . GERD (gastroesophageal reflux disease)   . BPH (benign prostatic hyperplasia)   . Macular degeneration     Past Surgical History  Procedure Laterality Date  . Tonsillectomy    . Hernia repair    . Hemorrhoid surgery    . Cataract extraction      History reviewed. No pertinent family history. Social History:  reports that he has never smoked. He does not have any smokeless tobacco history on file. His alcohol and drug histories are not on file.  Allergies:  Allergies  Allergen Reactions  . Codeine      (Not in a hospital admission)  No results found for this or any previous visit (from the past 48 hour(s)). No results found.  Review of Systems  Constitutional: Negative for fever.  HENT: Positive for hearing loss.   Respiratory: Negative for shortness of breath.   Cardiovascular: Negative for chest pain.  Gastrointestinal: Negative for nausea, vomiting and abdominal pain.  Musculoskeletal: Positive for back pain.  Neurological: Positive for weakness. Negative for dizziness and headaches.    Blood pressure 170/86, pulse 62, temperature 96 F (35.6 C), temperature source Oral, resp. rate 16, height 5\' 10"  (1.778 m), weight 157 lb (71.215 kg), SpO2 99.00%. Physical Exam  Constitutional: He is oriented to person, place, and time. He appears well-developed and well-nourished.  Cardiovascular: Normal rate, regular rhythm and normal heart sounds.   No murmur heard. Respiratory: Effort normal and breath sounds normal. He has no wheezes.  GI: Soft. Bowel sounds are normal. There is no tenderness.  Musculoskeletal: Normal  range of motion.  Gait slow; uses cane  Neurological: He is alert and oriented to person, place, and time.  Skin: Skin is warm and dry.  Psychiatric: He has a normal mood and affect. His behavior is normal. Judgment and thought content normal.     Assessment/Plan L5 and sacral fxs per MRI Scheduled for VP/KP and Sacroplasty in IR Pt and family aware of procedure benefits and risks and agreeable to proceed Consent signed and in chart  Jonathan Newton A 02/12/2013, 8:46 AM

## 2013-02-12 NOTE — Procedures (Signed)
S/P Lf VP . S/P bilateral S1 VP(sacroploasty)

## 2013-02-18 ENCOUNTER — Other Ambulatory Visit (HOSPITAL_COMMUNITY): Payer: Self-pay | Admitting: Interventional Radiology

## 2013-02-18 DIAGNOSIS — N186 End stage renal disease: Secondary | ICD-10-CM

## 2013-02-26 ENCOUNTER — Ambulatory Visit (HOSPITAL_COMMUNITY): Admission: RE | Admit: 2013-02-26 | Payer: Medicare Other | Source: Ambulatory Visit

## 2013-03-04 ENCOUNTER — Ambulatory Visit (HOSPITAL_COMMUNITY): Payer: Medicare Other

## 2013-03-05 ENCOUNTER — Ambulatory Visit (HOSPITAL_COMMUNITY)
Admission: RE | Admit: 2013-03-05 | Discharge: 2013-03-05 | Disposition: A | Discharge status: Home / Self Care | Payer: Medicare Other | Source: Ambulatory Visit | Attending: Interventional Radiology | Admitting: Interventional Radiology

## 2013-03-05 DIAGNOSIS — N186 End stage renal disease: Secondary | ICD-10-CM

## 2013-06-15 ENCOUNTER — Encounter (HOSPITAL_COMMUNITY): Payer: Self-pay

## 2013-06-15 ENCOUNTER — Emergency Department (HOSPITAL_COMMUNITY)
Admission: EM | Admit: 2013-06-15 | Discharge: 2013-06-15 | Disposition: A | Discharge status: Home / Self Care | Payer: Medicare Other | Attending: Emergency Medicine | Admitting: Emergency Medicine

## 2013-06-15 ENCOUNTER — Emergency Department (HOSPITAL_COMMUNITY): Payer: Medicare Other

## 2013-06-15 DIAGNOSIS — Y93H2 Activity, gardening and landscaping: Secondary | ICD-10-CM | POA: Insufficient documentation

## 2013-06-15 DIAGNOSIS — E785 Hyperlipidemia, unspecified: Secondary | ICD-10-CM | POA: Insufficient documentation

## 2013-06-15 DIAGNOSIS — N400 Enlarged prostate without lower urinary tract symptoms: Secondary | ICD-10-CM | POA: Insufficient documentation

## 2013-06-15 DIAGNOSIS — Z7982 Long term (current) use of aspirin: Secondary | ICD-10-CM | POA: Insufficient documentation

## 2013-06-15 DIAGNOSIS — Z885 Allergy status to narcotic agent status: Secondary | ICD-10-CM | POA: Insufficient documentation

## 2013-06-15 DIAGNOSIS — Z79899 Other long term (current) drug therapy: Secondary | ICD-10-CM | POA: Insufficient documentation

## 2013-06-15 DIAGNOSIS — W19XXXA Unspecified fall, initial encounter: Secondary | ICD-10-CM

## 2013-06-15 DIAGNOSIS — W010XXA Fall on same level from slipping, tripping and stumbling without subsequent striking against object, initial encounter: Secondary | ICD-10-CM | POA: Insufficient documentation

## 2013-06-15 DIAGNOSIS — H3530 Unspecified macular degeneration: Secondary | ICD-10-CM | POA: Insufficient documentation

## 2013-06-15 DIAGNOSIS — S42301A Unspecified fracture of shaft of humerus, right arm, initial encounter for closed fracture: Secondary | ICD-10-CM

## 2013-06-15 DIAGNOSIS — K219 Gastro-esophageal reflux disease without esophagitis: Secondary | ICD-10-CM | POA: Insufficient documentation

## 2013-06-15 DIAGNOSIS — S42309A Unspecified fracture of shaft of humerus, unspecified arm, initial encounter for closed fracture: Secondary | ICD-10-CM | POA: Insufficient documentation

## 2013-06-15 DIAGNOSIS — Y92009 Unspecified place in unspecified non-institutional (private) residence as the place of occurrence of the external cause: Secondary | ICD-10-CM | POA: Insufficient documentation

## 2013-06-15 LAB — CBC WITH DIFFERENTIAL/PLATELET
Basophils Absolute: 0 10*3/uL (ref 0.0–0.1)
Basophils Relative: 0 % (ref 0–1)
Eosinophils Absolute: 0.1 10*3/uL (ref 0.0–0.7)
HCT: 45.3 % (ref 39.0–52.0)
MCH: 30.3 pg (ref 26.0–34.0)
MCHC: 34.4 g/dL (ref 30.0–36.0)
Monocytes Absolute: 0.8 10*3/uL (ref 0.1–1.0)
Neutro Abs: 11.2 10*3/uL — ABNORMAL HIGH (ref 1.7–7.7)
RDW: 13.5 % (ref 11.5–15.5)

## 2013-06-15 LAB — BASIC METABOLIC PANEL
Calcium: 9.2 mg/dL (ref 8.4–10.5)
Chloride: 105 mEq/L (ref 96–112)
Creatinine, Ser: 1.16 mg/dL (ref 0.50–1.35)
GFR calc Af Amer: 60 mL/min — ABNORMAL LOW (ref >90)
GFR calc non Af Amer: 52 mL/min — ABNORMAL LOW (ref >90)

## 2013-06-15 MED ORDER — HYDROMORPHONE HCL PF 1 MG/ML IJ SOLN
0.5000 mg | Freq: Once | INTRAMUSCULAR | Status: AC
  Administered 2013-06-15: 0.5 mg via INTRAVENOUS
  Filled 2013-06-15: qty 1

## 2013-06-15 MED ORDER — ONDANSETRON HCL 4 MG/2ML IJ SOLN
4.0000 mg | Freq: Once | INTRAMUSCULAR | Status: AC
  Administered 2013-06-15: 4 mg via INTRAVENOUS
  Filled 2013-06-15 (×2): qty 2

## 2013-06-15 MED ORDER — DIAZEPAM 5 MG PO TABS
5.0000 mg | ORAL_TABLET | Freq: Once | ORAL | Status: AC
  Administered 2013-06-15: 5 mg via ORAL
  Filled 2013-06-15: qty 1

## 2013-06-15 MED ORDER — OXYCODONE-ACETAMINOPHEN 5-325 MG PO TABS: ORAL_TABLET | ORAL | Status: AC

## 2013-06-15 MED ORDER — SODIUM CHLORIDE 0.9 % IV BOLUS (SEPSIS)
500.0000 mL | Freq: Once | INTRAVENOUS | Status: AC
  Administered 2013-06-15: 500 mL via INTRAVENOUS

## 2013-06-15 MED ORDER — MORPHINE SULFATE 4 MG/ML IJ SOLN
4.0000 mg | Freq: Once | INTRAMUSCULAR | Status: AC
  Administered 2013-06-15: 4 mg via INTRAVENOUS
  Filled 2013-06-15: qty 1

## 2013-06-15 NOTE — Progress Notes (Signed)
Orthopedic Tech Progress Note Patient Details:  Jonathan Newton 1917/02/02 [id number]  Ortho Devices Type of Ortho Device: Ace wrap;Arm sling;Coapt;Sugartong splint Ortho Device/Splint Location: RUE Ortho Device/Splint Interventions: Ordered;Application   Jennye Moccasin 06/15/2013, 7:25 PM

## 2013-06-15 NOTE — ED Provider Notes (Signed)
CSN: [id number]     Arrival date & time 06/15/13  1651 History   First MD Initiated Contact with Patient 06/15/13 1653     Chief Complaint  Patient presents with  . Arm Pain  . Fall   (Consider location/radiation/quality/duration/timing/severity/associated sxs/prior Treatment) HPI  Jonathan Newton is a 79 y.o. male  brought in by EMS after falling while doing some yard work. Patient was working on a embankment and states that his feet became tangled and he fell. Patient denies loss of consciousness, head trauma,, headache dizziness, chest pain, shortness of breath, change in vision, dark or bloody stools. Patient was warned by his son to not perform yard work as he has macular degeneration and is prone to falling. Patient is apologizing to his son. Patient reports a severe right upper arm pain, just abated by movement. Patient denies distal numbness, paresthesia, coolness.  Past Medical History  Diagnosis Date  . Hyperlipidemia   . GERD (gastroesophageal reflux disease)   . BPH (benign prostatic hyperplasia)   . Macular degeneration    Past Surgical History  Procedure Laterality Date  . Tonsillectomy    . Hernia repair    . Hemorrhoid surgery    . Cataract extraction     History reviewed. No pertinent family history. History  Substance Use Topics  . Smoking status: Never Smoker   . Smokeless tobacco: Not on file  . Alcohol Use: Not on file    Review of Systems 10 systems reviewed and found to be negative, except as noted in the HPI  Allergies  Codeine  Home Medications   Current Outpatient Rx  Name  Route  Sig  Dispense  Refill  . aspirin EC 81 MG tablet   Oral   Take 81 mg by mouth daily.         . beta carotene w/minerals (OCUVITE) tablet   Oral   Take 1 tablet by mouth daily.         . ciprofloxacin (CIPRO) 250 MG tablet   Oral   Take 250 mg by mouth 2 (two) times daily. For 7 days         . finasteride (PROSCAR) 5 MG tablet   Oral   Take 5 mg by mouth  daily.         . naproxen sodium (ANAPROX) 220 MG tablet   Oral   Take 220 mg by mouth 2 (two) times daily with a meal.         . omeprazole (PRILOSEC) 20 MG capsule   Oral   Take 20 mg by mouth daily.          BP 157/81  Pulse 95  Temp(Src) 97.8 F (36.6 C) (Oral)  Resp 15  SpO2 96% Physical Exam  Nursing note and vitals reviewed. Constitutional: He is oriented to person, place, and time. He appears well-developed and well-nourished. No distress.  HENT:  Head: Normocephalic.  Mouth/Throat: Oropharynx is clear and moist.  Eyes: Conjunctivae and EOM are normal. Pupils are equal, round, and reactive to light.  Neck: Normal range of motion.  No midline tenderness to palpation or step-offs appreciated. Patient has full range of motion without pain.   Cardiovascular: Normal rate, regular rhythm and intact distal pulses.   Pulmonary/Chest: Effort normal and breath sounds normal. No stridor. No respiratory distress. He has no wheezes. He has no rales. He exhibits no tenderness.  Abdominal: Soft. Bowel sounds are normal. He exhibits no distension and no mass. There is  no tenderness. There is no rebound and no guarding.  Musculoskeletal: Normal range of motion.  Patient has deformity to right humerus. Radial pulse is strong 2+ bilaterally. Patient's is neurovascularly intact with distal sensation grossly intact and x-ray range of motion to the fingers and wrist.  Neurological: He is alert and oriented to person, place, and time.  Psychiatric: He has a normal mood and affect.    ED Course  Procedures (including critical care time) Labs Review Labs Reviewed  CBC WITH DIFFERENTIAL - Abnormal; Notable for the following:    WBC 13.0 (*)    Neutrophils Relative % 86 (*)    Neutro Abs 11.2 (*)    Lymphocytes Relative 7 (*)    All other components within normal limits  BASIC METABOLIC PANEL - Abnormal; Notable for the following:    Glucose, Bld 124 (*)    GFR calc non Af Amer 52  (*)    GFR calc Af Amer 60 (*)    All other components within normal limits   Imaging Review No results found.  MDM  No diagnosis found. Filed Vitals:   06/15/13 1654 06/15/13 1817 06/15/13 1900 06/15/13 1941  BP: 157/81 161/79 148/73 149/77  Pulse: 95 63 75   Temp: 97.8 F (36.6 C)   97.3 F (36.3 C)  TempSrc: Oral   Oral  Resp: 15 13 17 18   SpO2: 96% 98% 96% 98%     Jonathan Newton is a 79 y.o. male with mechanical fall and right humeral fracture. Patient has remote right radial fracture. Patient is placed in a coaptation splint. Head and C-spine CT and chest x-ray are negative. Bloodwork shows no gross abnormalities. Discussed with attending and feel he is appropriate for discharge. Patient lives at home with his wife, his daughter lives about him and his son lives down the road. As per the son he is comfortable sending his father home.  This is a shared visit with the attending physician who personally evaluated the patient and agrees with the care plan.   Medications  sodium chloride 0.9 % bolus 500 mL (0 mLs Intravenous Stopped 06/15/13 1938)  morphine 4 MG/ML injection 4 mg (4 mg Intravenous Given 06/15/13 1714)  ondansetron (ZOFRAN) injection 4 mg (4 mg Intravenous Given 06/15/13 1715)  HYDROmorphone (DILAUDID) injection 0.5 mg (0.5 mg Intravenous Given 06/15/13 1846)  diazepam (VALIUM) tablet 5 mg (5 mg Oral Given 06/15/13 1939)    Pt is hemodynamically stable, appropriate for, and amenable to discharge at this time. Pt verbalized understanding and agrees with care plan. All questions answered. Outpatient follow-up and specific return precautions discussed.    New Prescriptions   OXYCODONE-ACETAMINOPHEN (PERCOCET/ROXICET) 5-325 MG PER TABLET    1 to 2 tabs PO q6hrs  PRN for pain    Note: Portions of this report may have been transcribed using voice recognition software. Every effort was made to ensure accuracy; however, inadvertent computerized transcription errors may be  present      Wynetta Emery, PA-C 06/18/13 [phone]

## 2013-06-15 NOTE — ED Provider Notes (Signed)
Patient relates he was trying to do some weeding on a Bank and he got caught up in the tall weeks and he fell. He has an obvious deformity of his right upper arm. He has good distal sensation in intact neuro. Patient denies feeling dizzy or feeling bad before he fell. Son states patient fell about 3 weeks ago and had a fracture of his spine at that time.  Medical screening examination/treatment/procedure(s) were conducted as a shared visit with non-physician practitioner(s) and myself.  I personally evaluated the patient during the encounter   Devoria Albe, MD, Franz Dell, MD 06/15/13 [phone]

## 2013-06-15 NOTE — ED Notes (Signed)
Pt fell while out in the yard. EMS arrived and noticed obvious right arm deformity. Pt rates pain at 3/4. (As long as arm does not move) PT did not hit his head and denies losing consciousness.  Vitals:BP-130 (palpate Pulses palpable belove area of injury prior to rotation and after.   18 g in LAC

## 2013-06-15 NOTE — ED Notes (Signed)
Pt alert, NAD, calm, cooperative, following commands, participatory, resps e/u, speaking in clear complete sentences, family at [facility], visitor at [facility] reports that wife who had recent upper extremity fx is out of sling and returned to full duty no restrictions (not detailed or specified), ED PA at [facility], ortho tech at [facility] splinting & slinging arm. VSS.

## 2013-06-15 NOTE — ED Notes (Signed)
Paged Ortho  

## 2013-06-19 NOTE — ED Provider Notes (Signed)
See prior note   Ward Givens, MD 06/19/13 [phone]

## 2013-12-13 DEATH — deceased

## 2014-01-11 ENCOUNTER — Telehealth: Payer: Self-pay

## 2014-01-11 ENCOUNTER — Encounter: Payer: Self-pay | Admitting: Interventional Radiology

## 2014-01-11 NOTE — Telephone Encounter (Signed)
Patient past away per Obituary in [place] [employer] °
